# Patient Record
Sex: Male | Born: 2000 | Race: White | Hispanic: No | Marital: Single | Smoking: Never smoker
Health system: Southern US, Community
[De-identification: ages and names within clinical notes are randomized; demographics above are authoritative.]

## PROBLEM LIST (undated history)

## (undated) DIAGNOSIS — F419 Anxiety disorder, unspecified: Secondary | ICD-10-CM

## (undated) DIAGNOSIS — F329 Major depressive disorder, single episode, unspecified: Secondary | ICD-10-CM

## (undated) DIAGNOSIS — F913 Oppositional defiant disorder: Secondary | ICD-10-CM

## (undated) DIAGNOSIS — F840 Autistic disorder: Secondary | ICD-10-CM

## (undated) DIAGNOSIS — F909 Attention-deficit hyperactivity disorder, unspecified type: Secondary | ICD-10-CM

## (undated) DIAGNOSIS — G930 Cerebral cysts: Secondary | ICD-10-CM

## (undated) DIAGNOSIS — F32A Depression, unspecified: Secondary | ICD-10-CM

## (undated) DIAGNOSIS — E079 Disorder of thyroid, unspecified: Secondary | ICD-10-CM

## (undated) DIAGNOSIS — R569 Unspecified convulsions: Secondary | ICD-10-CM

## (undated) HISTORY — DX: Disorder of thyroid, unspecified: E07.9

## (undated) HISTORY — DX: Cerebral cysts: G93.0

## (undated) HISTORY — DX: Oppositional defiant disorder: F91.3

## (undated) HISTORY — DX: Attention-deficit hyperactivity disorder, unspecified type: F90.9

## (undated) HISTORY — DX: Unspecified convulsions: R56.9

---
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
## 2018-02-20 ENCOUNTER — Emergency Department (HOSPITAL_COMMUNITY)
Admission: EM | Admit: 2018-02-20 | Discharge: 2018-02-20 | Disposition: A | Discharge status: Home / Self Care | Payer: Medicaid Other | Attending: Emergency Medicine | Admitting: Emergency Medicine

## 2018-02-20 ENCOUNTER — Encounter (HOSPITAL_COMMUNITY): Payer: Self-pay | Admitting: Emergency Medicine

## 2018-02-20 ENCOUNTER — Other Ambulatory Visit: Payer: Self-pay

## 2018-02-20 DIAGNOSIS — S51851A Open bite of right forearm, initial encounter: Secondary | ICD-10-CM | POA: Diagnosis not present

## 2018-02-20 DIAGNOSIS — Z79899 Other long term (current) drug therapy: Secondary | ICD-10-CM | POA: Diagnosis not present

## 2018-02-20 DIAGNOSIS — Y998 Other external cause status: Secondary | ICD-10-CM | POA: Insufficient documentation

## 2018-02-20 DIAGNOSIS — X838XXA Intentional self-harm by other specified means, initial encounter: Secondary | ICD-10-CM | POA: Insufficient documentation

## 2018-02-20 DIAGNOSIS — Y92129 Unspecified place in nursing home as the place of occurrence of the external cause: Secondary | ICD-10-CM | POA: Insufficient documentation

## 2018-02-20 DIAGNOSIS — Y939 Activity, unspecified: Secondary | ICD-10-CM | POA: Diagnosis not present

## 2018-02-20 DIAGNOSIS — F840 Autistic disorder: Secondary | ICD-10-CM | POA: Insufficient documentation

## 2018-02-20 LAB — RAPID URINE DRUG SCREEN, HOSP PERFORMED
Amphetamines: NOT DETECTED
Barbiturates: NOT DETECTED
Benzodiazepines: POSITIVE — AB
Cocaine: NOT DETECTED
Opiates: NOT DETECTED
Tetrahydrocannabinol: NOT DETECTED

## 2018-02-20 LAB — COMPREHENSIVE METABOLIC PANEL
ALT: 78 U/L — AB (ref 0–44)
AST: 45 U/L — AB (ref 15–41)
Albumin: 3.9 g/dL (ref 3.5–5.0)
Alkaline Phosphatase: 98 U/L (ref 38–126)
Anion gap: 11 (ref 5–15)
BILIRUBIN TOTAL: 0.6 mg/dL (ref 0.3–1.2)
BUN: 6 mg/dL (ref 6–20)
CO2: 23 mmol/L (ref 22–32)
Calcium: 9 mg/dL (ref 8.9–10.3)
Chloride: 105 mmol/L (ref 98–111)
Creatinine, Ser: 0.63 mg/dL (ref 0.61–1.24)
Glucose, Bld: 124 mg/dL — ABNORMAL HIGH (ref 70–99)
Potassium: 3.2 mmol/L — ABNORMAL LOW (ref 3.5–5.1)
Sodium: 139 mmol/L (ref 135–145)
TOTAL PROTEIN: 7.1 g/dL (ref 6.5–8.1)

## 2018-02-20 LAB — CBC
HCT: 40.6 % (ref 39.0–52.0)
Hemoglobin: 14.4 g/dL (ref 13.0–17.0)
MCH: 32.6 pg (ref 26.0–34.0)
MCHC: 35.5 g/dL (ref 30.0–36.0)
MCV: 91.9 fL (ref 78.0–100.0)
PLATELETS: 341 10*3/uL (ref 150–400)
RBC: 4.42 MIL/uL (ref 4.22–5.81)
RDW: 11.7 % (ref 11.5–15.5)
WBC: 6.4 10*3/uL (ref 4.0–10.5)

## 2018-02-20 LAB — SALICYLATE LEVEL: Salicylate Lvl: <7 mg/dL (ref 2.8–30.0)

## 2018-02-20 LAB — ACETAMINOPHEN LEVEL: Acetaminophen (Tylenol), Serum: <10 ug/mL — ABNORMAL LOW (ref 10–30)

## 2018-02-20 LAB — ETHANOL

## 2018-02-20 NOTE — Discharge Instructions (Addendum)
Follow-up with your primary care doctor.  Return to the Emergency Department for any redness or swelling around the bite wound, fevers, thoughts of wanting her to kill anybody else or yourself or any other worsening or concerning symptoms.

## 2018-02-20 NOTE — ED Triage Notes (Signed)
PT presents by [employer] voluntarily for evaluation of self harm via biting. [facility] reports that pt was picked up from group home.
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
## 2018-02-20 NOTE — ED Notes (Signed)
FYI: [facility] [place]. Contact person is Phebe CollaZelma Wolfe [phone]

## 2018-02-20 NOTE — ED Notes (Signed)
Patrick [name] from group home called back and will be picking patient up within a hour.  Address given

## 2018-02-20 NOTE — ED Notes (Signed)
Called and left voicemail to Phebe CollaZelma Wolfe at [phone] to pick up patient.

## 2018-11-28 ENCOUNTER — Other Ambulatory Visit: Payer: Self-pay

## 2018-11-28 ENCOUNTER — Encounter (HOSPITAL_BASED_OUTPATIENT_CLINIC_OR_DEPARTMENT_OTHER): Payer: Self-pay

## 2018-11-28 ENCOUNTER — Emergency Department (HOSPITAL_BASED_OUTPATIENT_CLINIC_OR_DEPARTMENT_OTHER)
Admission: EM | Admit: 2018-11-28 | Discharge: 2018-11-28 | Disposition: A | Discharge status: Home / Self Care | Payer: Medicaid Other | Attending: Emergency Medicine | Admitting: Emergency Medicine

## 2018-11-28 DIAGNOSIS — S0081XA Abrasion of other part of head, initial encounter: Secondary | ICD-10-CM | POA: Diagnosis not present

## 2018-11-28 DIAGNOSIS — Y9302 Activity, running: Secondary | ICD-10-CM | POA: Insufficient documentation

## 2018-11-28 DIAGNOSIS — F902 Attention-deficit hyperactivity disorder, combined type: Secondary | ICD-10-CM | POA: Insufficient documentation

## 2018-11-28 DIAGNOSIS — Z79899 Other long term (current) drug therapy: Secondary | ICD-10-CM | POA: Diagnosis not present

## 2018-11-28 DIAGNOSIS — Y92480 Sidewalk as the place of occurrence of the external cause: Secondary | ICD-10-CM | POA: Insufficient documentation

## 2018-11-28 DIAGNOSIS — F840 Autistic disorder: Secondary | ICD-10-CM | POA: Diagnosis not present

## 2018-11-28 DIAGNOSIS — F913 Oppositional defiant disorder: Secondary | ICD-10-CM | POA: Diagnosis not present

## 2018-11-28 DIAGNOSIS — F419 Anxiety disorder, unspecified: Secondary | ICD-10-CM | POA: Diagnosis not present

## 2018-11-28 DIAGNOSIS — F329 Major depressive disorder, single episode, unspecified: Secondary | ICD-10-CM | POA: Diagnosis not present

## 2018-11-28 DIAGNOSIS — F71 Moderate intellectual disabilities: Secondary | ICD-10-CM | POA: Insufficient documentation

## 2018-11-28 DIAGNOSIS — Y998 Other external cause status: Secondary | ICD-10-CM | POA: Diagnosis not present

## 2018-11-28 DIAGNOSIS — W19XXXA Unspecified fall, initial encounter: Secondary | ICD-10-CM

## 2018-11-28 DIAGNOSIS — S0993XA Unspecified injury of face, initial encounter: Secondary | ICD-10-CM | POA: Diagnosis present

## 2018-11-28 DIAGNOSIS — W01198A Fall on same level from slipping, tripping and stumbling with subsequent striking against other object, initial encounter: Secondary | ICD-10-CM | POA: Diagnosis not present

## 2018-11-28 MED ORDER — IBUPROFEN 800 MG PO TABS
800.0000 mg | ORAL_TABLET | Freq: Once | ORAL | Status: AC
  Administered 2018-11-28: 800 mg via ORAL
  Filled 2018-11-28: qty 1

## 2018-11-28 NOTE — Discharge Instructions (Addendum)
Adult head injury: You have had a head injury which does not appear to require admission at this time. A concussion is a state of changed mental ability from trauma.  Please wash the wounds daily with soap and water.  Apply antibiotic ointment to them.  Watch for signs of infection including draining pus, surrounding redness, fever. Continue to apply ice to the head.  Do not apply for longer than 20 minutes at a time.  Follow-up with primary care doctor in 2 to 5 days for wound recheck.  SEEK IMMEDIATE MEDICAL ATTENTION IF:  - There is confusion or drowsiness (although children frequently become drowsy after injury). You cannot awaken the injuredperson.   - There is nausea (feeling sick to your stomach) or continued, forceful vomiting. You notice dizziness or unsteadiness which is getting worse, or inability to walk.   - You have convulsions or unconsciousness.   - You experience severe, persistent headaches not relieved by Tylenol. (Do not take aspirin as this impairs clotting abilities). Take other pain medications only as directed.   - You cannot use arms or legs normally.   - There are changes in pupil sizes. (This is the black center in the colored part of the eye)   - There is clear or bloody discharge from the nose or ears.   - Change in speech, vision, swallowing, or understanding.   - Localized weakness, numbness, tingling, or change in bowel or bladder control.
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
## 2018-11-28 NOTE — ED Notes (Signed)
Patient given Ice pack. Attempted to clean up wound on face. Patient doesn't want anything to touch his face.
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
## 2018-11-30 ENCOUNTER — Telehealth (HOSPITAL_BASED_OUTPATIENT_CLINIC_OR_DEPARTMENT_OTHER): Payer: Self-pay | Admitting: Emergency Medicine

## 2018-11-30 NOTE — Telephone Encounter (Signed)
Received call from [facility] who is a staff member at group home where pt resided. He was inquiring about a prescription not being sent to pharmacy for Ibuprofen. After reviewing chart, provider did not write any prescriptions. I advised Stephon of this and he became very argumentative. Made contact with the facilities director, Dr. Doreene Eland. I explained to her there was not a prescription written for pt. She verbalized understanding and was agreeable with contacting his regular doctor if the patient needed any pain medication.

## 2019-05-19 ENCOUNTER — Other Ambulatory Visit: Payer: Self-pay

## 2019-05-19 DIAGNOSIS — Z20822 Contact with and (suspected) exposure to covid-19: Secondary | ICD-10-CM

## 2019-05-20 LAB — NOVEL CORONAVIRUS, NAA: SARS-CoV-2, NAA: NOT DETECTED
